# Patient Record
Sex: Male | Born: 1975 | Race: Black or African American | Hispanic: No | Marital: Married | Smoking: Never smoker
Health system: Southern US, Community
[De-identification: ages and names within clinical notes are randomized; demographics above are authoritative.]

---

## 2018-04-29 ENCOUNTER — Emergency Department (HOSPITAL_COMMUNITY): Payer: Self-pay

## 2018-04-29 ENCOUNTER — Encounter (HOSPITAL_COMMUNITY): Payer: Self-pay

## 2018-04-29 ENCOUNTER — Emergency Department (HOSPITAL_COMMUNITY)
Admission: EM | Admit: 2018-04-29 | Discharge: 2018-04-29 | Disposition: A | Discharge status: Home / Self Care | Payer: Self-pay | Attending: Emergency Medicine | Admitting: Emergency Medicine

## 2018-04-29 DIAGNOSIS — R55 Syncope and collapse: Secondary | ICD-10-CM | POA: Insufficient documentation

## 2018-04-29 DIAGNOSIS — T148XXA Other injury of unspecified body region, initial encounter: Secondary | ICD-10-CM | POA: Insufficient documentation

## 2018-04-29 DIAGNOSIS — Y92410 Unspecified street and highway as the place of occurrence of the external cause: Secondary | ICD-10-CM | POA: Insufficient documentation

## 2018-04-29 DIAGNOSIS — Y998 Other external cause status: Secondary | ICD-10-CM | POA: Insufficient documentation

## 2018-04-29 DIAGNOSIS — Y939 Activity, unspecified: Secondary | ICD-10-CM | POA: Insufficient documentation

## 2018-04-29 DIAGNOSIS — T07XXXA Unspecified multiple injuries, initial encounter: Secondary | ICD-10-CM

## 2018-04-29 MED ORDER — IBUPROFEN 600 MG PO TABS: 600.0000 mg | ORAL_TABLET | Freq: Four times a day (QID) | ORAL | 0 refills | Status: AC | PRN
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
## 2018-04-29 NOTE — ED Notes (Signed)
Patient given urinal.

## 2018-04-29 NOTE — ED Notes (Signed)
Patient in CT
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
